# Patient Record
Sex: Female | Born: 1980 | Hispanic: No | Marital: Single | Smoking: Never smoker
Health system: Southern US, Community
[De-identification: ages and names within clinical notes are randomized; demographics above are authoritative.]

## PROBLEM LIST (undated history)

## (undated) DIAGNOSIS — F329 Major depressive disorder, single episode, unspecified: Secondary | ICD-10-CM

## (undated) DIAGNOSIS — F32A Depression, unspecified: Secondary | ICD-10-CM

## (undated) DIAGNOSIS — S62609A Fracture of unspecified phalanx of unspecified finger, initial encounter for closed fracture: Secondary | ICD-10-CM

## (undated) DIAGNOSIS — B009 Herpesviral infection, unspecified: Secondary | ICD-10-CM

## (undated) DIAGNOSIS — F419 Anxiety disorder, unspecified: Secondary | ICD-10-CM

## (undated) HISTORY — DX: Fracture of unspecified phalanx of unspecified finger, initial encounter for closed fracture: S62.609A

## (undated) HISTORY — DX: Depression, unspecified: F32.A

## (undated) HISTORY — DX: Major depressive disorder, single episode, unspecified: F32.9

## (undated) HISTORY — DX: Herpesviral infection, unspecified: B00.9

## (undated) HISTORY — PX: FACIAL FRACTURE SURGERY: SHX1570

## (undated) HISTORY — DX: Anxiety disorder, unspecified: F41.9

---

## 1998-08-05 ENCOUNTER — Other Ambulatory Visit: Admission: RE | Admit: 1998-08-05 | Discharge: 1998-08-05 | Payer: Self-pay | Admitting: Obstetrics and Gynecology

## 1999-09-21 ENCOUNTER — Other Ambulatory Visit: Admission: RE | Admit: 1999-09-21 | Discharge: 1999-09-21 | Payer: Self-pay | Admitting: Gynecology

## 2000-08-21 ENCOUNTER — Other Ambulatory Visit: Admission: RE | Admit: 2000-08-21 | Discharge: 2000-08-21 | Payer: Self-pay | Admitting: Gynecology

## 2001-04-03 ENCOUNTER — Encounter: Admission: RE | Admit: 2001-04-03 | Discharge: 2001-04-03 | Payer: Self-pay | Admitting: Gynecology

## 2001-04-30 ENCOUNTER — Encounter: Payer: Self-pay | Admitting: Emergency Medicine

## 2001-04-30 ENCOUNTER — Emergency Department (HOSPITAL_COMMUNITY): Admission: EM | Admit: 2001-04-30 | Discharge: 2001-04-30 | Payer: Self-pay

## 2001-05-27 ENCOUNTER — Inpatient Hospital Stay (HOSPITAL_COMMUNITY): Admission: AD | Admit: 2001-05-27 | Discharge: 2001-05-30 | Payer: Self-pay | Admitting: *Deleted

## 2001-07-16 ENCOUNTER — Other Ambulatory Visit: Admission: RE | Admit: 2001-07-16 | Discharge: 2001-07-16 | Payer: Self-pay | Admitting: Gynecology

## 2002-10-31 ENCOUNTER — Inpatient Hospital Stay (HOSPITAL_COMMUNITY): Admission: AD | Admit: 2002-10-31 | Discharge: 2002-10-31 | Payer: Self-pay | Admitting: Obstetrics and Gynecology

## 2002-10-31 ENCOUNTER — Encounter: Payer: Self-pay | Admitting: Obstetrics and Gynecology

## 2004-03-10 ENCOUNTER — Ambulatory Visit: Payer: Self-pay | Admitting: Family Medicine

## 2004-03-14 ENCOUNTER — Ambulatory Visit (HOSPITAL_COMMUNITY): Admission: RE | Admit: 2004-03-14 | Discharge: 2004-03-14 | Payer: Self-pay | Admitting: Family Medicine

## 2004-03-28 ENCOUNTER — Ambulatory Visit (HOSPITAL_COMMUNITY): Admission: RE | Admit: 2004-03-28 | Discharge: 2004-03-28 | Payer: Self-pay | Admitting: Family Medicine

## 2004-05-19 ENCOUNTER — Ambulatory Visit: Payer: Self-pay | Admitting: Sports Medicine

## 2004-05-27 ENCOUNTER — Ambulatory Visit: Payer: Self-pay | Admitting: Sports Medicine

## 2004-06-08 ENCOUNTER — Encounter: Admission: RE | Admit: 2004-06-08 | Discharge: 2004-06-08 | Payer: Self-pay | Admitting: Family Medicine

## 2004-06-10 ENCOUNTER — Ambulatory Visit: Payer: Self-pay | Admitting: Family Medicine

## 2004-06-29 ENCOUNTER — Ambulatory Visit: Payer: Self-pay | Admitting: Family Medicine

## 2004-08-04 ENCOUNTER — Ambulatory Visit: Payer: Self-pay | Admitting: Sports Medicine

## 2004-08-12 ENCOUNTER — Ambulatory Visit: Payer: Self-pay | Admitting: Family Medicine

## 2004-08-15 ENCOUNTER — Ambulatory Visit: Payer: Self-pay | Admitting: Obstetrics and Gynecology

## 2004-08-15 ENCOUNTER — Inpatient Hospital Stay (HOSPITAL_COMMUNITY): Admission: AD | Admit: 2004-08-15 | Discharge: 2004-08-15 | Payer: Self-pay | Admitting: *Deleted

## 2004-08-16 ENCOUNTER — Ambulatory Visit: Payer: Self-pay | Admitting: Family Medicine

## 2004-08-22 ENCOUNTER — Ambulatory Visit: Payer: Self-pay | Admitting: Sports Medicine

## 2004-08-25 ENCOUNTER — Ambulatory Visit: Payer: Self-pay | Admitting: Obstetrics & Gynecology

## 2004-08-25 ENCOUNTER — Inpatient Hospital Stay (HOSPITAL_COMMUNITY): Admission: AD | Admit: 2004-08-25 | Discharge: 2004-08-28 | Payer: Self-pay | Admitting: Obstetrics & Gynecology

## 2004-10-06 ENCOUNTER — Ambulatory Visit: Payer: Self-pay | Admitting: Family Medicine

## 2005-03-21 ENCOUNTER — Emergency Department (HOSPITAL_COMMUNITY): Admission: EM | Admit: 2005-03-21 | Discharge: 2005-03-21 | Payer: Self-pay | Admitting: Emergency Medicine

## 2005-07-28 ENCOUNTER — Ambulatory Visit: Payer: Self-pay | Admitting: Family Medicine

## 2005-07-31 ENCOUNTER — Ambulatory Visit: Payer: Self-pay | Admitting: Internal Medicine

## 2005-07-31 LAB — PULMONARY FUNCTION TEST

## 2005-08-17 ENCOUNTER — Ambulatory Visit: Payer: Self-pay | Admitting: Family Medicine

## 2005-09-05 ENCOUNTER — Ambulatory Visit: Payer: Self-pay | Admitting: Family Medicine

## 2005-09-28 ENCOUNTER — Ambulatory Visit: Payer: Self-pay | Admitting: Family Medicine

## 2005-12-03 ENCOUNTER — Encounter (INDEPENDENT_AMBULATORY_CARE_PROVIDER_SITE_OTHER): Payer: Self-pay | Admitting: *Deleted

## 2005-12-03 LAB — CONVERTED CEMR LAB

## 2005-12-15 ENCOUNTER — Ambulatory Visit: Payer: Self-pay | Admitting: Family Medicine

## 2006-04-27 ENCOUNTER — Encounter (INDEPENDENT_AMBULATORY_CARE_PROVIDER_SITE_OTHER): Payer: Self-pay | Admitting: *Deleted

## 2007-01-18 ENCOUNTER — Ambulatory Visit: Payer: Self-pay | Admitting: Family Medicine

## 2007-01-18 DIAGNOSIS — F419 Anxiety disorder, unspecified: Secondary | ICD-10-CM

## 2007-01-18 DIAGNOSIS — F329 Major depressive disorder, single episode, unspecified: Secondary | ICD-10-CM

## 2007-06-05 ENCOUNTER — Telehealth: Payer: Self-pay | Admitting: Family Medicine

## 2007-10-11 ENCOUNTER — Encounter: Payer: Self-pay | Admitting: Family Medicine

## 2007-12-31 ENCOUNTER — Telehealth: Payer: Self-pay | Admitting: Family Medicine

## 2008-01-13 ENCOUNTER — Encounter: Payer: Self-pay | Admitting: *Deleted

## 2008-01-13 ENCOUNTER — Ambulatory Visit: Payer: Self-pay | Admitting: Family Medicine

## 2008-01-15 ENCOUNTER — Ambulatory Visit: Payer: Self-pay | Admitting: Pulmonary Disease

## 2008-01-16 ENCOUNTER — Encounter: Payer: Self-pay | Admitting: Family Medicine

## 2009-12-10 ENCOUNTER — Emergency Department (HOSPITAL_COMMUNITY): Admission: EM | Admit: 2009-12-10 | Discharge: 2009-12-10 | Payer: Self-pay | Admitting: Emergency Medicine

## 2009-12-20 ENCOUNTER — Ambulatory Visit (HOSPITAL_BASED_OUTPATIENT_CLINIC_OR_DEPARTMENT_OTHER): Admission: RE | Admit: 2009-12-20 | Discharge: 2009-12-20 | Payer: Self-pay | Admitting: Otolaryngology

## 2010-07-15 NOTE — Discharge Summary (Signed)
[facility]  Patient:    Claudia Dominguez, Claudia Dominguez Visit Number: [id number] MRN: [id number]          Service Type: OBS Location: 910A 9108 01 Attending Physician:  Wetzel Bjornstad Dictated by:   Antony Contras, [facility] Admit Date:  05/27/2001 Discharge Date: 05/30/2001                             Discharge Summary  DISCHARGE DIAGNOSES:          1. Intrauterine pregnancy.                               2. Premature rupture of membranes.  PROCEDURE:                    Normal spontaneous vaginal delivery of viable infant over intact perineum with repair of first degree vaginal laceration.  HISTORY OF PRESENT ILLNESS:   The patient is a 30 year old primigravida with an LMP of August 23, 2000, [facility] May 30, 2001.  PRENATAL LABORATORY DATA:     Blood type B positive, antibody screen negative. RPR, HBSAG, and HIV nonreactive.  Rubella equivocal. GBS negative.  HOSPITAL COURSE:              The patient was admitted at [redacted] weeks gestation complaining of rupture of membranes 24 hours prior and was noted to have a positive fern and nitrazine.  She was admitted to labor and delivery for Pitocin augmentation and was also given Clindamycin secondary to penicillin allergy and being greater than 24 hours ruptured.  Cervix was 2 cm, 50%, and -2 station.  She did progress to complete dilatation and delivered an Apgars 9 and 64 female infant, weight 8 pounds 11 ounces over intact perineum with repair of first degree vaginal laceration.  Postpartum course, she remained afebrile, had no difficulty with voiding, and was able to be discharged in satisfactory condition on her second postpartum day.  CBC; hematocrit 24.5, hemoglobin 8.3, WBC 12.9, platelets 140.  DISPOSITION:                  Follow up in six weeks.  Continue with prenatal vitamins and iron.  Motrin for pain. Dictated by:   Antony Contras, [facility] Attending Physician:  Wetzel Bjornstad DD:  06/17/01 TD:   06/17/01 Job: [id number]

## 2010-07-15 NOTE — H&P (Signed)
[facility]  Patient:    Claudia Dominguez, Claudia Dominguez Visit Number: [id number] MRN: [id number]          Service Type: OBS Location: 910B 9157 01 Attending Physician:  Wetzel Bjornstad Dictated by:   Katy Fitch, M.D. Admit Date:  05/27/2001                           History and Physical  CHIEF COMPLAINT:              Rupture of membranes.  HISTORY OF PRESENT ILLNESS:   The patient is a 30 year old, G1, at 96 weeks, who presents to the office complaining of rupture of membranes 24 hours prior. The patient denies any fevers, nausea, vomiting, chills, vaginal bleeding, or decreased fetal movement.  The patient was seen in the office and noted to have positive fern and nitrazine and was admitted to labor and delivery for augmentation.  PAST MEDICAL HISTORY:         Negative.  PAST SURGICAL HISTORY:        Negative.  MEDICATIONS:                  Prenatal vitamins.  ALLERGIES:                    PENICILLIN.  SOCIAL HISTORY:               Denies any tobacco, alcohol, or drugs.  FAMILY HISTORY:               Without any mental retardation or epithelial cancers.  PHYSICAL EXAMINATION:  VITAL SIGNS:                  Blood pressure is 130/80.  HEENT:                        Clear.  LUNGS:                        Clear to auscultation bilaterally.  HEART:                        Regular rate and rhythm.  ABDOMEN:                      Gravid and nontender.  Estimated fetal weight 8 pounds 2 ounces.  CERVIX:                       2, 50, and -2.  Sterile speculum showed a positive Valsalva, nitrazine, and ferning.  ASSESSMENT AND PLAN:          38. A 30 year old G1 at 70 weeks with premature                                  rupture of membranes.                               2. Will admit to labor and delivery for                                  augmentation with Pitocin.  3. Will start clindamycin secondary to                         penicillin allergy and being greater than 18                                  hours ruptured. Dictated by:   Katy Fitch, M.D. Attending Physician:  Wetzel Bjornstad DD:  05/27/01 TD:  05/27/01 Job: [id number]

## 2010-12-28 ENCOUNTER — Emergency Department (INDEPENDENT_AMBULATORY_CARE_PROVIDER_SITE_OTHER): Payer: Self-pay

## 2010-12-28 ENCOUNTER — Emergency Department (HOSPITAL_BASED_OUTPATIENT_CLINIC_OR_DEPARTMENT_OTHER)
Admission: EM | Admit: 2010-12-28 | Discharge: 2010-12-29 | Disposition: A | Discharge status: Home / Self Care | Payer: Self-pay | Attending: Emergency Medicine | Admitting: Emergency Medicine

## 2010-12-28 DIAGNOSIS — M25579 Pain in unspecified ankle and joints of unspecified foot: Secondary | ICD-10-CM

## 2010-12-28 DIAGNOSIS — T148XXA Other injury of unspecified body region, initial encounter: Secondary | ICD-10-CM

## 2010-12-28 DIAGNOSIS — S93409A Sprain of unspecified ligament of unspecified ankle, initial encounter: Secondary | ICD-10-CM | POA: Insufficient documentation

## 2010-12-28 DIAGNOSIS — X500XXA Overexertion from strenuous movement or load, initial encounter: Secondary | ICD-10-CM

## 2010-12-28 DIAGNOSIS — M79609 Pain in unspecified limb: Secondary | ICD-10-CM

## 2010-12-28 DIAGNOSIS — S93402A Sprain of unspecified ligament of left ankle, initial encounter: Secondary | ICD-10-CM

## 2010-12-28 MED ORDER — IBUPROFEN 800 MG PO TABS: 800.0000 mg | ORAL_TABLET | Freq: Three times a day (TID) | ORAL | Status: AC

## 2010-12-28 MED ORDER — IBUPROFEN 800 MG PO TABS
800.0000 mg | ORAL_TABLET | Freq: Once | ORAL | Status: AC
  Administered 2010-12-28: 800 mg via ORAL
  Filled 2010-12-28: qty 1

## 2010-12-28 MED ORDER — OXYCODONE-ACETAMINOPHEN 5-325 MG PO TABS
1.0000 | ORAL_TABLET | Freq: Once | ORAL | Status: AC
  Administered 2010-12-28: 1 via ORAL
  Filled 2010-12-28: qty 1

## 2010-12-28 NOTE — ED Notes (Signed)
Fell down steps-pain to left foot and ankle

## 2010-12-29 NOTE — ED Provider Notes (Signed)
History     CSN: [id number] Arrival date & time: 12/28/2010  9:43 PM   First MD Initiated Contact with Patient 12/28/10 2150      Chief Complaint  Patient presents with  . Ankle Injury    (Consider location/radiation/quality/duration/timing/severity/associated sxs/prior treatment) Patient is a 30 y.o. female presenting with lower extremity injury. The history is provided by the patient.  Ankle Injury This is a new problem. The current episode started less than 1 hour ago. The problem occurs constantly. The problem has not changed since onset.Pertinent negatives include no chest pain, no abdominal pain and no shortness of breath. The symptoms are aggravated by standing and walking. The symptoms are relieved by nothing. She has tried nothing for the symptoms.   Walking down a step and inverted her left ankle with injury, pain and swelling. It hurts to bear weight. She did fall onto an outstretched left hand and sustained an abrasion but denies any significant injury. She denies any wrist elbow or shoulder pain he just denies any knee or hip injury. Severity is moderate. No radiation of ankle pain. Do not give medications prior to arrival. she is requesting Percocet History reviewed. No pertinent past medical history.  Past Surgical History  Procedure Date  . Facial fracture surgery     No family history on file.  History  Substance Use Topics  . Smoking status: Never Smoker   . Smokeless tobacco: Not on file  . Alcohol Use: Yes    OB History    Grav Para Term Preterm Abortions TAB SAB Ect Mult Living                  Review of Systems  Constitutional: Negative for fever and chills.  HENT: Negative for neck pain.   Eyes: Negative for pain.  Respiratory: Negative for shortness of breath.   Cardiovascular: Negative for chest pain.  Gastrointestinal: Negative for abdominal pain.  Genitourinary: Negative for dysuria.  Musculoskeletal: Negative for back pain.  Skin:  Negative for rash.  Neurological: Negative for weakness and numbness.  All other systems reviewed and are negative.    Allergies  Penicillins  Home Medications   Current Outpatient Rx  Name Route Sig Dispense Refill  . IBUPROFEN 800 MG PO TABS Oral Take 1 tablet (800 mg total) by mouth 3 (three) times daily. 21 tablet 0    BP 108/62  Pulse 64  Temp(Src) 97.9 F (36.6 C) (Oral)  Resp 16  Ht 5\' 7"  (1.702 m)  Wt 160 lb (72.576 kg)  BMI 25.06 kg/m2  SpO2 98%  LMP 11/28/2010  Physical Exam  Constitutional: She is oriented to person, place, and time. She appears well-developed and well-nourished.  HENT:  Head: Normocephalic and atraumatic.  Eyes: Conjunctivae and EOM are normal. Pupils are equal, round, and reactive to light.  Neck: Full passive range of motion without pain. Neck supple. No thyromegaly present.       No midline tenderness or deformity to cervical, thoracic and lumbar spine  Cardiovascular: Normal rate, regular rhythm, S1 normal, S2 normal and intact distal pulses.   Pulmonary/Chest: Effort normal and breath sounds normal.  Abdominal: Soft. Bowel sounds are normal. There is no tenderness. There is no CVA tenderness.  Musculoskeletal:       Left lower extremity: Tenderness and swelling over the area of the lateral malleolus. There is mild ecchymosis. Skin is intact throughout there is also mild tenderness over the dorsolateral aspect of the foot. Dorsalis pedis pulse is  equal and intact. Distal motor and sensorium intact. There is no tenderness over the proximal fibula, knee or the hip. Left hand: Mild superficial abrasion to the palm. No bony tenderness or swelling. No wrist, elbow or shoulder tenderness  Neurological: She is alert and oriented to person, place, and time. She has normal strength and normal reflexes. No cranial nerve deficit or sensory deficit. She displays a negative Romberg sign. GCS eye subscore is 4. GCS verbal subscore is 5. GCS motor subscore is  6.       No focal deficits  Skin: Skin is warm and dry. No rash noted. No cyanosis. Nails show no clubbing.  Psychiatric: She has a normal mood and affect. Her speech is normal and behavior is normal.    ED Course  Procedures (including critical care time)  Labs Reviewed - No data to display Dg Ankle Complete Left  12/28/2010  *RADIOLOGY REPORT*  Clinical Data: Left ankle pain after fall with twisting injury. Swelling.  LEFT ANKLE COMPLETE - 3+ VIEW  Comparison: None.  Findings: Lateral soft tissue swelling.  The left ankle appears intact.  Talar dome are intact. No evidence of acute fracture or subluxation.  No focal bone lesions.  Bone matrix and cortex appear intact.  No abnormal radiopaque densities in the soft tissues.  IMPRESSION: Lateral soft tissue swelling.  No acute is demonstrated.  Original Report Authenticated By: Marlon Pel, M.D.   Dg Foot Complete Left  12/28/2010  *RADIOLOGY REPORT*  Clinical Data: Left foot pain and swelling after trauma.  LEFT FOOT - COMPLETE 3+ VIEW  Comparison: None.  Findings: The left foot appears intact. No evidence of acute fracture or subluxation.  No focal bone lesions.  Bone matrix and cortex appear intact.  No abnormal radiopaque densities in the soft tissues.  IMPRESSION: No acute bony abnormalities demonstrated.  Original Report Authenticated By: Marlon Pel, M.D.     1. Sprain of left ankle   2. Abrasion       MDM   Left ankle injury after fall prior to arrival. Pain medications provided and x-rays obtained and reviewed as above. Splint placed and crutches provided. Questions answered. Ankle sprain precautions and instructions provided to patient states understanding. Stable for discharge home with plan to follow up as needed for any persistent or worsening symptoms.       Sunnie Nielsen, MD 12/29/10 [phone]

## 2011-06-27 ENCOUNTER — Other Ambulatory Visit: Payer: Self-pay | Admitting: Physician Assistant

## 2011-06-28 ENCOUNTER — Telehealth: Payer: Self-pay

## 2011-06-28 NOTE — Telephone Encounter (Signed)
We never received a request. Please pull paper chart to see what pill she is on.

## 2011-06-28 NOTE — Telephone Encounter (Signed)
Pt states she has an appointment on the 9th. Her birth control pills have been denied based on an office visit.  [facility]  Please call [phone]

## 2011-06-29 NOTE — Telephone Encounter (Signed)
Pt was upset when I returned her call.  Advised her that we were unable to fill meds. She states that she was bleeding and has been for a while and that she has been waiting 3 days on her to give her an answer.  She used cursed words and got really upset.  Advised her that per Marylene Land it is against the medical board to refill med without being seen in a year.  Advised pt that she is welcome to come in today before her appt and she stated that she could not come in.  She stated that she did have a Pap with the [facility]..  I was speaking with Marylene Land in regards to her maybe calling the Health Dept to get a refill since she went there when she did not have insurance and pt hung up on me.

## 2011-06-29 NOTE — Telephone Encounter (Signed)
Unable to prescribe the medication because even though she has an appointment, it's been 1 1/2 yrs since she has been here

## 2011-07-06 ENCOUNTER — Ambulatory Visit (INDEPENDENT_AMBULATORY_CARE_PROVIDER_SITE_OTHER): Payer: No Typology Code available for payment source | Admitting: Physician Assistant

## 2011-07-06 ENCOUNTER — Encounter: Payer: Self-pay | Admitting: Physician Assistant

## 2011-07-06 VITALS — BP 112/75 | HR 75 | Temp 97.1°F | Resp 18 | Ht 68.0 in | Wt 160.0 lb

## 2011-07-06 DIAGNOSIS — F411 Generalized anxiety disorder: Secondary | ICD-10-CM

## 2011-07-06 DIAGNOSIS — M25559 Pain in unspecified hip: Secondary | ICD-10-CM

## 2011-07-06 DIAGNOSIS — Z309 Encounter for contraceptive management, unspecified: Secondary | ICD-10-CM

## 2011-07-06 DIAGNOSIS — F419 Anxiety disorder, unspecified: Secondary | ICD-10-CM

## 2011-07-06 MED ORDER — MELOXICAM 15 MG PO TABS: 15.0000 mg | ORAL_TABLET | Freq: Every day | ORAL | Status: AC

## 2011-07-06 MED ORDER — ALPRAZOLAM 0.25 MG PO TABS: 0.2500 mg | ORAL_TABLET | Freq: Three times a day (TID) | ORAL | Status: AC | PRN

## 2011-07-06 MED ORDER — NORGESTIMATE-ETH ESTRADIOL 0.25-35 MG-MCG PO TABS: 1.0000 | ORAL_TABLET | Freq: Every day | ORAL | Status: DC

## 2011-07-06 NOTE — Progress Notes (Signed)
  Subjective:    Patient ID: Claudia Dominguez, female    DOB: 04-06-1980, 31 y.o.   MRN: [id number]  HPI  Anxiety causing SOB.  At work-likes being busy.  Social situations. Daily for the last few months. No URI-type or allergy-type symptoms.  Both hips hurting.  Worse after, not during, activity.  Running, extended walking, sex (especially if she's on top, or legs are up).  No improvement with NSAIDS.  Some better with sitting, lying down, but not complete resolution.  She needs a refill of her COC.  Review of Systems As above.    Objective:   Physical Exam Vital signs noted. Well-developed, well nourished WF who is awake, alert and oriented, in NAD. HEENT: [place]/AT, sclera and conjunctiva are clear.   Neck: supple, non-tender, no lymphadenopathy, thyromegaly. Heart: RRR, no murmur Lungs: CTA Abdomen: normo-active bowel sounds, supple, non-tender, no mass or organomegaly. Extremities: no cyanosis, clubbing or edema.  FROM of the hips and knees.  Pain anterior hips with full flexion and with aDduction.  No tenderness on palpation. Skin: warm and dry without rash.      Assessment & Plan:   1. Contraception management  norgestimate-ethinyl estradiol (ORTHO-CYCLEN,SPRINTEC,PREVIFEM) 0.25-35 MG-MCG tablet  2. Hip pain  Trial of daily Meloxicam.  If symptoms persist, will refer to ortho and/or PT.  3. Anxiety  Prn Alprazolam.  Schedule CPE in 3-4 weeks.  Reassess then.

## 2011-07-06 NOTE — Patient Instructions (Signed)
Driven to Distraction

## 2011-09-14 ENCOUNTER — Ambulatory Visit: Payer: No Typology Code available for payment source | Admitting: Physician Assistant

## 2011-11-28 ENCOUNTER — Telehealth: Payer: Self-pay

## 2011-11-28 NOTE — Telephone Encounter (Signed)
I think patient needs to come in for this issue. She is asking for a call back. I am happy to call her back, she lacks insurance but if her heart beat is rapid she needs to be seen, especially if her meds are not helping.

## 2011-11-28 NOTE — Telephone Encounter (Signed)
Pulled chart and there is no record of this, she needs evaluation for this. Patient has been taking Xanax and now states she feels her heart racing. She is upset at need for office visit and has disconnected phone call. She is very stressed about lack of insurance and need for office visit. I think it is very important she come in for office visit to evaluate this. She states Xanax does not help at all. I am going to call her back tomorrow to speak to her again. She seems very upset and stressed, I need to speak to her again to advise of need for evaluation of this, even if it is not here. I will call her back tomorrow.

## 2011-11-28 NOTE — Telephone Encounter (Signed)
It does not seem like she was here for a rapid heartbeat. Is this a new issue? If so she will likely need to be re-evaluated. Which medication is not helping? She was placed on several at last OV.

## 2011-11-28 NOTE — Telephone Encounter (Signed)
Thanks, I called patient back about this

## 2011-11-28 NOTE — Telephone Encounter (Signed)
Pt states heart beat is too rapid,meds not helping,requesting call back from chelle in this regard,cannot come back in due to lack of ins.    Best phone [phone]

## 2011-11-28 NOTE — Telephone Encounter (Signed)
Patient indicates she was here in August for this issue, there is nothing in computer, I will pull her paper chart, and see what is in there.

## 2011-11-30 NOTE — Telephone Encounter (Signed)
Left message for her to reiterate the importance of office visit or ER visit for eval of shortness of breath and heart racing.

## 2012-07-05 ENCOUNTER — Encounter: Payer: Self-pay | Admitting: Internal Medicine

## 2012-08-21 ENCOUNTER — Other Ambulatory Visit: Payer: Self-pay | Admitting: Physician Assistant

## 2012-09-26 ENCOUNTER — Ambulatory Visit: Payer: No Typology Code available for payment source | Admitting: Physician Assistant

## 2012-10-10 ENCOUNTER — Ambulatory Visit: Payer: No Typology Code available for payment source | Admitting: Physician Assistant

## 2012-10-17 ENCOUNTER — Other Ambulatory Visit: Payer: Self-pay | Admitting: Physician Assistant

## 2012-10-18 ENCOUNTER — Other Ambulatory Visit: Payer: Self-pay | Admitting: Physician Assistant

## 2013-01-16 ENCOUNTER — Encounter: Payer: No Typology Code available for payment source | Admitting: Physician Assistant

## 2013-04-24 ENCOUNTER — Ambulatory Visit: Payer: No Typology Code available for payment source | Admitting: Physician Assistant

## 2013-05-01 ENCOUNTER — Ambulatory Visit: Payer: No Typology Code available for payment source | Admitting: Physician Assistant

## 2013-05-01 ENCOUNTER — Telehealth: Payer: Self-pay | Admitting: General Practice

## 2013-05-01 NOTE — Telephone Encounter (Signed)
Patient called wanting to know if she can get a refill on ALPRAZolam (XANAX) 0.25 MG tablet  Please advise

## 2013-05-01 NOTE — Telephone Encounter (Signed)
Left detailed message on machine, also expressed to call back with any questions.

## 2013-05-01 NOTE — Telephone Encounter (Signed)
Has not been seen here in nearly 2 yrs.  Needs OV to establish with a PCP

## 2013-05-02 NOTE — Telephone Encounter (Signed)
Based on review of her chart, she was last seen here 07/06/2011.  I am not able to refill any medications for her based on that information.  Has she been seen here under another name?  Please verify name, dob, etc and double check that we are evaluating the correct patient/chart.  If we are, then encourage her to come in for evaluation.

## 2013-05-02 NOTE — Telephone Encounter (Signed)
Spoke to patient, she stated she is "in here all the time" with her family as well. Last OV i see is 07/06/11. She stated this did not seem correct. Would like a refill for xanax. Please advise.

## 2013-05-02 NOTE — Telephone Encounter (Signed)
LM for rtn call. 

## 2013-05-05 NOTE — Telephone Encounter (Signed)
Pt.notified

## 2013-05-29 ENCOUNTER — Ambulatory Visit: Payer: No Typology Code available for payment source | Admitting: Physician Assistant

## 2014-01-09 ENCOUNTER — Ambulatory Visit (INDEPENDENT_AMBULATORY_CARE_PROVIDER_SITE_OTHER): Payer: BC Managed Care – PPO | Admitting: Family Medicine

## 2014-01-09 ENCOUNTER — Encounter: Payer: Self-pay | Admitting: Family Medicine

## 2014-01-09 ENCOUNTER — Ambulatory Visit: Payer: Self-pay | Admitting: Family Medicine

## 2014-01-09 ENCOUNTER — Ambulatory Visit (INDEPENDENT_AMBULATORY_CARE_PROVIDER_SITE_OTHER): Payer: BC Managed Care – PPO

## 2014-01-09 VITALS — BP 112/72 | HR 63 | Temp 97.7°F | Ht 67.0 in | Wt 177.0 lb

## 2014-01-09 DIAGNOSIS — S62609A Fracture of unspecified phalanx of unspecified finger, initial encounter for closed fracture: Secondary | ICD-10-CM | POA: Diagnosis not present

## 2014-01-09 DIAGNOSIS — R635 Abnormal weight gain: Secondary | ICD-10-CM | POA: Diagnosis not present

## 2014-01-09 DIAGNOSIS — M25742 Osteophyte, left hand: Secondary | ICD-10-CM

## 2014-01-09 DIAGNOSIS — H6983 Other specified disorders of Eustachian tube, bilateral: Secondary | ICD-10-CM

## 2014-01-09 DIAGNOSIS — S62657S Nondisplaced fracture of medial phalanx of left little finger, sequela: Secondary | ICD-10-CM

## 2014-01-09 DIAGNOSIS — M79645 Pain in left finger(s): Secondary | ICD-10-CM

## 2014-01-09 DIAGNOSIS — Y93K1 Activity, walking an animal: Secondary | ICD-10-CM

## 2014-01-09 DIAGNOSIS — H6990 Unspecified Eustachian tube disorder, unspecified ear: Secondary | ICD-10-CM | POA: Insufficient documentation

## 2014-01-09 DIAGNOSIS — H6980 Other specified disorders of Eustachian tube, unspecified ear: Secondary | ICD-10-CM | POA: Insufficient documentation

## 2014-01-09 DIAGNOSIS — X58XXXS Exposure to other specified factors, sequela: Secondary | ICD-10-CM

## 2014-01-09 HISTORY — DX: Fracture of unspecified phalanx of unspecified finger, initial encounter for closed fracture: S62.609A

## 2014-01-09 MED ORDER — TOPIRAMATE 50 MG PO TABS: 50.0000 mg | ORAL_TABLET | Freq: Two times a day (BID) | ORAL | Status: DC

## 2014-01-09 NOTE — Progress Notes (Signed)
CC: Claudia Dominguez is a 33 y.o. female is here for Establish Care; Finger Injury; and Ear Pain   Subjective: HPI:  Pleasant 33 year old here to establish care   patient planes of difficulty with weight gain over the past 4 months.She tells me she watches what she eats, she exercises regularly and despite this has gained adipose over the last 4 months. She's never had this difficulty in the past. She has no family history of thyroid disease. She has not considered any medication to help with this as of yet.  She complains of left finger pain and swelling that has been present since September, early September. It happened within minutes after she was walking her dog holding the leash with only her left small finger and with a sudden jerk "dislocated"her finger in her own words. She tells me that the end of the finger was bent at a 90 angle and she straightened it itherself within seconds after the injury. Since then she's had mild pain and moderate swelling at the DIP with lateral deviation of the distal phalanges. Interventions have included buddy taping. Doesn't particularly makes this pain or swelling better or worse.  episodic bilateral ear pain that has been present for matter of years that occurs a few times a week. Nothing particularly makes it better or worse other than waiting for 30 seconds for it to go away on its own. Is described as sharp, deep within the ear and nonradiating. Interventions have included Mucinex with no help.  Review of Systems - General ROS: negative for - chills, fever, night sweats, weight gain or weight loss Ophthalmic ROS: negative for - decreased vision Psychological ROS: negative for - anxiety or depression ENT ROS: negative for - hearing change, nasal congestion, tinnitus or allergies Hematological and Lymphatic ROS: negative for - bleeding problems, bruising or swollen lymph nodes Breast ROS: negative Respiratory ROS: no cough, shortness of breath, or  wheezing Cardiovascular ROS: no chest pain or dyspnea on exertion Gastrointestinal ROS: no abdominal pain, change in bowel habits, or black or bloody stools Genito-Urinary ROS: negative for - genital discharge, genital ulcers, incontinence or abnormal bleeding from genitals Musculoskeletal ROS: negative for - joint pain or muscle painother than that described above Neurological ROS: negative for - headaches or memory loss Dermatological ROS: negative for lumps, mole changes, rash and skin lesion changes  History reviewed. No pertinent past medical history.  Past Surgical History  Procedure Laterality Date  . Facial fracture surgery     Family History  Problem Relation Age of Onset  . Alcoholism      parent  . Leukemia      grandparent  . Heart attack      grandmother  . Diabetes      grandmother  . Hyperlipidemia Father   . Stroke      grandmother     History   Social History  . Marital Status: Single    Spouse Name: N/A    Number of Children: N/A  . Years of Education: N/A   Occupational History  . Not on file.   Social History Main Topics  . Smoking status: Never Smoker   . Smokeless tobacco: Not on file  . Alcohol Use: 0.0 oz/week    0 Not specified per week     Comment: occasional  . Drug Use: No  . Sexual Activity: Not Currently    Birth Control/ Protection: None   Other Topics Concern  . Not on file   Social  History Narrative     Objective: BP 112/72 mmHg  Pulse 63  Temp(Src) 97.7 F (36.5 C) (Oral)  Ht 5\' 7"  (1.702 m)  Wt 177 lb (80.287 kg)  BMI 27.72 kg/m2  General: Alert and Oriented, No Acute Distress HEENT: Pupils equal, round, reactive to light. Conjunctivae clear.  External ears unremarkable, canals clear with intact TMs with appropriate landmarks.  Middle ear appears open without effusion. Pink inferior turbinates.  Moist mucous membranes, pharynx without inflammation nor lesions.  Neck supple without palpable lymphadenopathy nor abnormal  masses. Lungs: clear comfortable work of breathing Extremities: No peripheral edema.  Strong peripheral pulses. She has good range of motion at the MCP, DIP, PIP of the left fifth digit when joints are isolated. There is moderate swelling and redness from the  PIP to just past the DIP, mild lateral deviation of the distal phalanges. Mental Status: No depression, anxiety, nor agitation. Skin: Warm and dry.  Assessment & Plan: Emi HolesCharlee was seen today for establish care, finger injury and ear pain.  Diagnoses and associated orders for this visit:  Abnormal weight gain - topiramate (TOPAMAX) 50 MG tablet; Take 1 tablet (50 mg total) by mouth 2 (two) times daily.  Finger pain, left - DG Finger Little Left; Future  Finger fracture, left, closed, initial encounter  Eustachian tube dysfunction, bilateral    Abnormal weight gain: Start Topamax, we will check TSH with upcoming physical. Joint decision to not start phentermine due to her history of anxiety and palpitations Left finger pain: X-ray was obtained showing a nondisplaced healing phalangeal shaft fracture. I'm not sure there is much to be done for this but I will discuss this with Dr. Karie Schwalbe in sports medicine before providing advice to the patient. Eustachian tube dysfunction: Encouraged her to try over-the-counter Flonase or Nasacort.  Return for As needed for annual physical..

## 2014-01-12 ENCOUNTER — Ambulatory Visit: Payer: Self-pay | Admitting: Family Medicine

## 2014-01-19 ENCOUNTER — Encounter: Payer: BC Managed Care – PPO | Admitting: Family Medicine

## 2014-01-19 ENCOUNTER — Telehealth: Payer: Self-pay | Admitting: *Deleted

## 2014-01-19 NOTE — Telephone Encounter (Signed)
Pt called and left a message on vm that she was scheduled at 830 for an appt today and arrived at 844 and was turned away. I called her back and left a message on her vm that our office policy is that if you are 10 min late for your appointment then you may be asked to reschedule.

## 2014-01-30 ENCOUNTER — Encounter: Payer: BC Managed Care – PPO | Admitting: Family Medicine

## 2014-02-02 ENCOUNTER — Encounter: Payer: Self-pay | Admitting: Family Medicine

## 2014-02-02 ENCOUNTER — Ambulatory Visit (INDEPENDENT_AMBULATORY_CARE_PROVIDER_SITE_OTHER): Payer: BC Managed Care – PPO | Admitting: Family Medicine

## 2014-02-02 VITALS — BP 110/69 | HR 69 | Ht 67.0 in | Wt 176.0 lb

## 2014-02-02 DIAGNOSIS — F418 Other specified anxiety disorders: Secondary | ICD-10-CM

## 2014-02-02 DIAGNOSIS — T7421XA Adult sexual abuse, confirmed, initial encounter: Secondary | ICD-10-CM | POA: Diagnosis not present

## 2014-02-02 DIAGNOSIS — F419 Anxiety disorder, unspecified: Secondary | ICD-10-CM

## 2014-02-02 DIAGNOSIS — Z0000 Encounter for general adult medical examination without abnormal findings: Secondary | ICD-10-CM | POA: Diagnosis not present

## 2014-02-02 DIAGNOSIS — F329 Major depressive disorder, single episode, unspecified: Secondary | ICD-10-CM

## 2014-02-02 MED ORDER — ESCITALOPRAM OXALATE 20 MG PO TABS: 20.0000 mg | ORAL_TABLET | Freq: Every day | ORAL | Status: DC
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
## 2014-02-03 LAB — LIPID PANEL
Cholesterol: 149 mg/dL (ref 0–200)
HDL: 42 mg/dL (ref >39)
LDL CALC: 89 mg/dL (ref 0–99)
Total CHOL/HDL Ratio: 3.5 Ratio
Triglycerides: 88 mg/dL (ref <150)
VLDL: 18 mg/dL (ref 0–40)

## 2014-02-03 LAB — COMPLETE METABOLIC PANEL WITH GFR
ALBUMIN: 4 g/dL (ref 3.5–5.2)
ALT: 10 U/L (ref 0–35)
AST: 14 U/L (ref 0–37)
Alkaline Phosphatase: 59 U/L (ref 39–117)
BILIRUBIN TOTAL: 0.5 mg/dL (ref 0.2–1.2)
BUN: 15 mg/dL (ref 6–23)
CHLORIDE: 110 meq/L (ref 96–112)
CO2: 24 meq/L (ref 19–32)
Calcium: 9.1 mg/dL (ref 8.4–10.5)
Creat: 0.84 mg/dL (ref 0.50–1.10)
GFR, Est Non African American: >89 mL/min
Glucose, Bld: 87 mg/dL (ref 70–99)
POTASSIUM: 4.3 meq/L (ref 3.5–5.3)
SODIUM: 147 meq/L — AB (ref 135–145)
TOTAL PROTEIN: 6.4 g/dL (ref 6.0–8.3)

## 2014-02-03 LAB — TSH: TSH: 1.844 u[IU]/mL (ref 0.350–4.500)

## 2014-02-03 LAB — RPR

## 2014-02-03 LAB — HIV ANTIBODY (ROUTINE TESTING W REFLEX): HIV: NONREACTIVE

## 2014-02-03 LAB — GC/CHLAMYDIA PROBE AMP
CT PROBE, AMP APTIMA: NEGATIVE
GC Probe RNA: NEGATIVE

## 2014-02-03 LAB — HEPATITIS C ANTIBODY: HCV Ab: NEGATIVE

## 2014-07-07 ENCOUNTER — Telehealth: Payer: Self-pay | Admitting: *Deleted

## 2014-07-07 MED ORDER — NORGESTIMATE-ETH ESTRADIOL 0.25-35 MG-MCG PO TABS: 1.0000 | ORAL_TABLET | Freq: Every day | ORAL | Status: DC

## 2014-07-07 NOTE — Telephone Encounter (Signed)
I'm ok with refilling the ortho - cyclen that's in her historical med list, Rx sent to [facility] on [address].

## 2014-07-07 NOTE — Telephone Encounter (Signed)
Pt.notified

## 2014-07-07 NOTE — Telephone Encounter (Signed)
Pt is requesting a rx for birth control. She was here in December for a CPE and she is current on her pap. Since this will be a new start birth control and has never been rx'd birth control from our office nor was this discussed at the last visit she needs an appt to discuss options,risks and benefits.Called and advised pt of this. Pt states Dr. Ivan AnchorsHommel said that she could call back and would write a rx for birth control

## 2014-07-07 NOTE — Telephone Encounter (Signed)
Message left on vm 

## 2014-09-15 ENCOUNTER — Ambulatory Visit (INDEPENDENT_AMBULATORY_CARE_PROVIDER_SITE_OTHER): Payer: Self-pay | Admitting: Family Medicine

## 2014-09-15 ENCOUNTER — Encounter: Payer: Self-pay | Admitting: Family Medicine

## 2014-09-15 VITALS — BP 114/70 | HR 62 | Wt 184.0 lb

## 2014-09-15 DIAGNOSIS — Z30014 Encounter for initial prescription of intrauterine contraceptive device: Secondary | ICD-10-CM

## 2014-09-15 DIAGNOSIS — Z3009 Encounter for other general counseling and advice on contraception: Secondary | ICD-10-CM | POA: Insufficient documentation

## 2014-09-15 DIAGNOSIS — S338XXA Sprain of other parts of lumbar spine and pelvis, initial encounter: Secondary | ICD-10-CM

## 2014-09-15 DIAGNOSIS — R635 Abnormal weight gain: Secondary | ICD-10-CM

## 2014-09-15 DIAGNOSIS — F1210 Cannabis abuse, uncomplicated: Secondary | ICD-10-CM

## 2014-09-15 DIAGNOSIS — S39012A Strain of muscle, fascia and tendon of lower back, initial encounter: Secondary | ICD-10-CM | POA: Insufficient documentation

## 2014-09-15 MED ORDER — CYCLOBENZAPRINE HCL 5 MG PO TABS: 5.0000 mg | ORAL_TABLET | Freq: Every evening | ORAL | Status: DC | PRN

## 2014-09-15 MED ORDER — NAPROXEN 500 MG PO TABS: 500.0000 mg | ORAL_TABLET | Freq: Two times a day (BID) | ORAL | Status: DC

## 2014-09-15 MED ORDER — TRAMADOL HCL 50 MG PO TABS: 50.0000 mg | ORAL_TABLET | Freq: Three times a day (TID) | ORAL | Status: DC | PRN

## 2014-09-15 NOTE — Assessment & Plan Note (Signed)
Lumbosacral strain. Trial of Flexeril naproxen and tramadol. Watchful waiting return as needed. Home exercise program as well as referral to physical therapy.

## 2014-09-15 NOTE — Assessment & Plan Note (Signed)
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 She agrees. Work on quitting. Discussed with counselor.
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
## 2014-09-15 NOTE — Assessment & Plan Note (Signed)
Patient seems to be a good candidate for Mirena. Refer to OB/GYN

## 2014-09-15 NOTE — Assessment & Plan Note (Signed)
No other medications to try at this time that are not incredibly expensive. Work on quitting marijuana.

## 2014-09-15 NOTE — Patient Instructions (Addendum)
.  Thank you for coming in today. Take naproxen twice daily and use Flexeril and tramadol sparingly for severe pain. Attend physical therapy Talk to your counsel about quitting marijuana OB/GYN should call you to discuss Mirena  Come back or go to the emergency room if you notice new weakness new numbness problems walking or bowel or bladder problems.  Lumbosacral Strain Lumbosacral strain is a strain of any of the parts that make up your lumbosacral vertebrae. Your lumbosacral vertebrae are the bones that make up the lower third of your backbone. Your lumbosacral vertebrae are held together by muscles and tough, fibrous tissue (ligaments).  CAUSES  A sudden blow to your back can cause lumbosacral strain. Also, anything that causes an excessive stretch of the muscles in the low back can cause this strain. This is typically seen when people exert themselves strenuously, fall, lift heavy objects, bend, or crouch repeatedly. RISK FACTORS  Physically demanding work.  Participation in pushing or pulling sports or sports that require a sudden twist of the back (tennis, golf, baseball).  Weight lifting.  Excessive lower back curvature.  Forward-tilted pelvis.  Weak back or abdominal muscles or both.  Tight hamstrings. SIGNS AND SYMPTOMS  Lumbosacral strain may cause pain in the area of your injury or pain that moves (radiates) down your leg.  DIAGNOSIS Your health care provider can often diagnose lumbosacral strain through a physical exam. In some cases, you may need tests such as X-ray exams.  TREATMENT  Treatment for your lower back injury depends on many factors that your clinician will have to evaluate. However, most treatment will include the use of anti-inflammatory medicines. HOME CARE INSTRUCTIONS   Avoid hard physical activities (tennis, racquetball, waterskiing) if you are not in proper physical condition for it. This may aggravate or create problems.  If you have a back  problem, avoid sports requiring sudden body movements. Swimming and walking are generally safer activities.  Maintain good posture.  Maintain a healthy weight.  For acute conditions, you may put ice on the injured area.  Put ice in a plastic bag.  Place a towel between your skin and the bag.  Leave the ice on for 20 minutes, 2-3 times a day.  When the low back starts healing, stretching and strengthening exercises may be recommended. SEEK MEDICAL CARE IF:  Your back pain is getting worse.  You experience severe back pain not relieved with medicines. SEEK IMMEDIATE MEDICAL CARE IF:   You have numbness, tingling, weakness, or problems with the use of your arms or legs.  There is a change in bowel or bladder control.  You have increasing pain in any area of the body, including your belly (abdomen).  You notice shortness of breath, dizziness, or feel faint.  You feel sick to your stomach (nauseous), are throwing up (vomiting), or become sweaty.  You notice discoloration of your toes or legs, or your feet get very cold. MAKE SURE YOU:   Understand these instructions.  Will watch your condition.  Will get help right away if you are not doing well or get worse. Document Released: 11/23/2004 Document Revised: 02/18/2013 Document Reviewed: 10/02/2012 [facility] Patient Information 2015 [employer] [place]. This information is not intended to replace advice given to you by your health care provider. Make sure you discuss any questions you have with your health care provider.

## 2014-09-18 ENCOUNTER — Telehealth: Payer: Self-pay

## 2014-09-18 NOTE — Telephone Encounter (Signed)
Called and left detailed VM with pt informing her that Dr. Denyse Amass thinks that contrave would be a good med for weigh loss for her. Requested a call back to schedule an appointment to discuss this further.

## 2014-10-06 ENCOUNTER — Telehealth: Payer: Self-pay

## 2014-10-06 NOTE — Telephone Encounter (Signed)
Spoke with patient, will call when ready to make appt.

## 2014-11-16 ENCOUNTER — Other Ambulatory Visit: Payer: Self-pay | Admitting: *Deleted

## 2014-11-16 DIAGNOSIS — F329 Major depressive disorder, single episode, unspecified: Secondary | ICD-10-CM

## 2014-11-16 DIAGNOSIS — F32A Depression, unspecified: Secondary | ICD-10-CM

## 2014-11-16 DIAGNOSIS — F419 Anxiety disorder, unspecified: Principal | ICD-10-CM

## 2014-11-16 MED ORDER — ESCITALOPRAM OXALATE 20 MG PO TABS: 20.0000 mg | ORAL_TABLET | Freq: Every day | ORAL | Status: DC

## 2015-01-06 ENCOUNTER — Other Ambulatory Visit: Payer: Self-pay

## 2015-01-06 DIAGNOSIS — F419 Anxiety disorder, unspecified: Principal | ICD-10-CM

## 2015-01-06 DIAGNOSIS — F329 Major depressive disorder, single episode, unspecified: Secondary | ICD-10-CM

## 2015-01-06 MED ORDER — ESCITALOPRAM OXALATE 20 MG PO TABS: 20.0000 mg | ORAL_TABLET | Freq: Every day | ORAL | Status: DC

## 2015-01-06 NOTE — Telephone Encounter (Signed)
15 day supply provided, follow up needed for any future refills.  If she's unable to afford an appointment with our office there is a free clinic in [place] that I used to work at that might be a good alternative until she gets insurance, they can be contacted at [phone]. [facility]

## 2015-01-06 NOTE — Telephone Encounter (Signed)
Pt is due for a follow up visit.  Pt was contacted to inform her that she is due for a f/u visit.  She expressed that she currently has no health insurance and asks that you just refill this medication without an visit. You have not seen this pt since last Dec.

## 2015-01-06 NOTE — Telephone Encounter (Signed)
Pt notified.  Pt wasn't not happy with the decision and was transferred to [place].

## 2015-03-10 ENCOUNTER — Encounter: Payer: Self-pay | Admitting: Family Medicine

## 2015-03-10 ENCOUNTER — Ambulatory Visit (INDEPENDENT_AMBULATORY_CARE_PROVIDER_SITE_OTHER): Payer: Self-pay | Admitting: Family Medicine

## 2015-03-10 DIAGNOSIS — Z0289 Encounter for other administrative examinations: Secondary | ICD-10-CM

## 2015-03-10 NOTE — Progress Notes (Signed)
No show 03/10/2015

## 2015-09-26 IMAGING — CR DG FINGER LITTLE 2+V*L*
1 series · 1 of 1 positions shown · non-contrast
Comparison: None.

CLINICAL DATA: Pinky caught in dog leash 6 weeks ago with obvious
deformity.

EXAM:
LEFT LITTLE FINGER 2+V

[view not recorded]
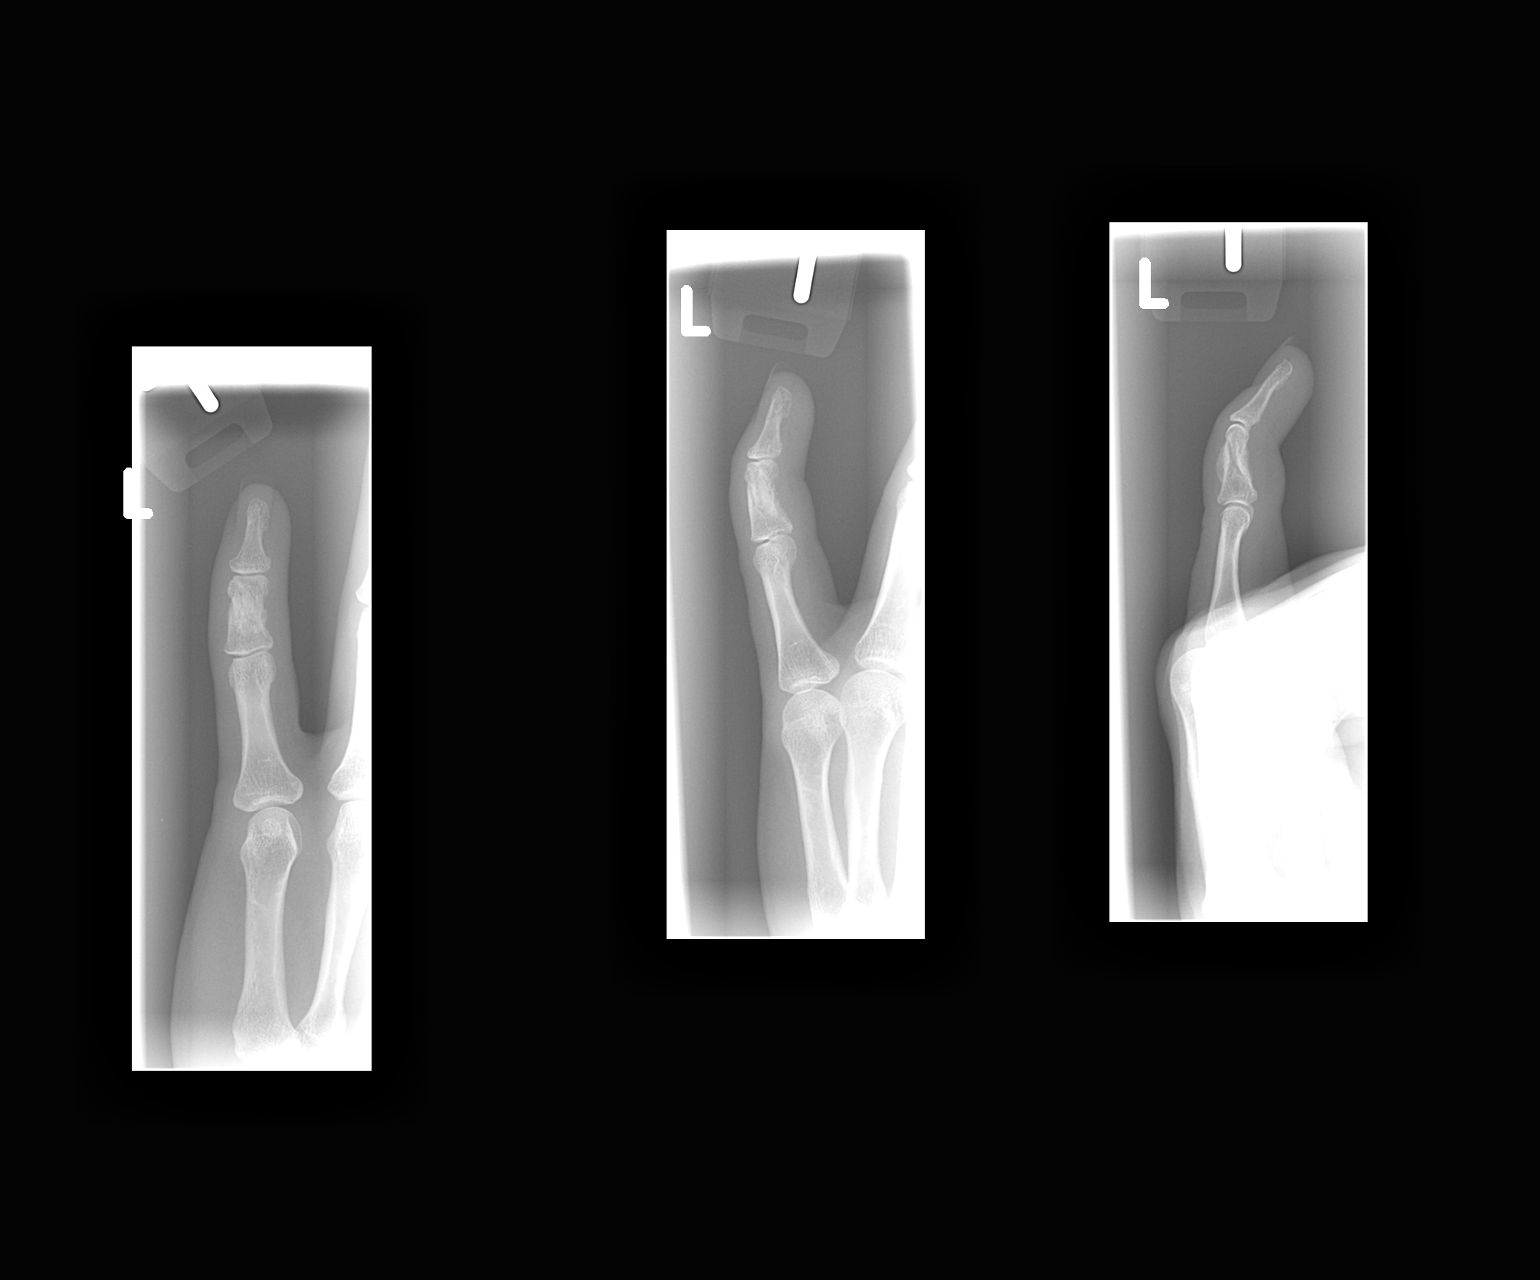

[1 of 1 positions shown; findings below may reference images not displayed]

FINDINGS: There is callus formation about a nondisplaced though slightly
comminuted fracture involving the mid aspect of the middle phalanx
of the fifth digit. No definite intra-articular extension. Joint
spaces are preserved. No erosions. Expected adjacent soft tissue
swelling. No radiopaque foreign body.
IMPRESSION: Callus formation about a nondisplaced though slightly comminuted
fracture involving the mid aspect of the middle phalanx of the fifth
digit.

## 2015-10-18 ENCOUNTER — Ambulatory Visit: Payer: Self-pay | Admitting: Family Medicine

## 2015-11-03 ENCOUNTER — Ambulatory Visit (INDEPENDENT_AMBULATORY_CARE_PROVIDER_SITE_OTHER): Payer: Self-pay | Admitting: Physician Assistant

## 2015-11-03 ENCOUNTER — Encounter: Payer: Self-pay | Admitting: Physician Assistant

## 2015-11-03 VITALS — BP 114/78 | HR 68 | Temp 98.0°F | Resp 17 | Ht 67.0 in | Wt 174.0 lb

## 2015-11-03 DIAGNOSIS — Z3009 Encounter for other general counseling and advice on contraception: Secondary | ICD-10-CM

## 2015-11-03 DIAGNOSIS — F32A Depression, unspecified: Secondary | ICD-10-CM

## 2015-11-03 DIAGNOSIS — F329 Major depressive disorder, single episode, unspecified: Secondary | ICD-10-CM

## 2015-11-03 DIAGNOSIS — F419 Anxiety disorder, unspecified: Principal | ICD-10-CM

## 2015-11-03 DIAGNOSIS — B009 Herpesviral infection, unspecified: Secondary | ICD-10-CM | POA: Insufficient documentation

## 2015-11-03 DIAGNOSIS — F418 Other specified anxiety disorders: Secondary | ICD-10-CM

## 2015-11-03 MED ORDER — NORGESTIMATE-ETH ESTRADIOL 0.25-35 MG-MCG PO TABS: 1.0000 | ORAL_TABLET | Freq: Every day | ORAL | 4 refills | Status: DC

## 2015-11-03 MED ORDER — ESCITALOPRAM OXALATE 20 MG PO TABS: 20.0000 mg | ORAL_TABLET | Freq: Every day | ORAL | 3 refills | Status: DC

## 2015-11-03 MED ORDER — VALACYCLOVIR HCL 1 G PO TABS
500.0000 mg | ORAL_TABLET | Freq: Every day | ORAL | 3 refills | Status: DC
Start: 2015-11-03 — End: 2017-07-19

## 2015-11-03 NOTE — Progress Notes (Signed)
Patient ID: Claudia Dominguez, female    DOB: 1980/11/07, 35 y.o.   MRN: [id number]  PCP: Laren Boom, DO  Subjective:   Chief Complaint  Patient presents with  . Medication Refill    lexapro. PHQ9 score 16  . Other    pt requesting birth control    HPI Presents for evaluation of depression/anxiety and needing prescription refills. She does not currently have health insurance.  She was previously taking Lexapro with good results but stopped it due to cost and feeling that she had things under control. Over the past year, she has been experiencing more symptoms of depression: fatigue, dysphoric mood, irritability, difficulty concentrating.   Her relationship with her older daughter has been particularly difficult, and Claudia Dominguez (age 34) has decided to live exclusively with her father, refusing to engage in conversation with the patient. Savannah still lives part time with each parent. In addition, due to her moodiness and irritability, her romantic relationship has just ended. That is really the trigger for wanting to resume treatment.  Trying to stay active and get sunlight.  Also ready to resume COC. Very moody during period. Anxiety and depression get much worse. Rage.   Depression screen PHQ 2/9 11/03/2015  Decreased Interest 0  Down, Depressed, Hopeless 3  PHQ - 2 Score 3  Altered sleeping 2  Tired, decreased energy 3  Change in appetite 2  Feeling bad or failure about yourself  3  Trouble concentrating 3  Moving slowly or fidgety/restless 0  Suicidal thoughts 0  PHQ-9 Score 16    Review of Systems As above.    Patient Active Problem List   Diagnosis Date Noted  . Lumbosacral strain 09/15/2014  . Marijuana abuse 09/15/2014  . Encounter for other general counseling or advice on contraception 09/15/2014  . Abnormal weight gain 01/09/2014  . Finger pain, left 01/09/2014  . Finger fracture, left 01/09/2014  . Eustachian tube dysfunction 01/09/2014  . DEPRESSION,  SITUATIONAL 01/18/2007     Prior to Admission medications   Medication Sig Start Date End Date Taking? Authorizing Provider  escitalopram (LEXAPRO) 20 MG tablet Take 1 tablet (20 mg total) by mouth daily. Please schedule a f/u appt. for future refills. 01/06/15  Yes Laren Boom, DO     Allergies  Allergen Reactions  . Latex Hives  . Penicillins Hives    REACTION: as a baby       Objective:  Physical Exam  Constitutional: She is oriented to person, place, and time. She appears well-developed and well-nourished. She is active and cooperative. No distress.  BP 114/78 (BP Location: Left Arm, Patient Position: Sitting, Cuff Size: Normal)   Pulse 68   Temp 98 F (36.7 C) (Oral)   Resp 17   Ht 5\' 7"  (1.702 m)   Wt 174 lb (78.9 kg)   LMP 10/28/2015   SpO2 99%   BMI 27.25 kg/m   HENT:  Head: Normocephalic and atraumatic.  Right Ear: Hearing normal.  Left Ear: Hearing normal.  Eyes: Conjunctivae are normal. No scleral icterus.  Neck: Normal range of motion. Neck supple. No thyromegaly present.  Cardiovascular: Normal rate, regular rhythm and normal heart sounds.   Pulses:      Radial pulses are 2+ on the right side, and 2+ on the left side.  Pulmonary/Chest: Effort normal and breath sounds normal.  Lymphadenopathy:       Head (right side): No tonsillar, no preauricular, no posterior auricular and no occipital adenopathy present.  Head (left side): No tonsillar, no preauricular, no posterior auricular and no occipital adenopathy present.    She has no cervical adenopathy.       Right: No supraclavicular adenopathy present.       Left: No supraclavicular adenopathy present.  Neurological: She is alert and oriented to person, place, and time. No sensory deficit.  Skin: Skin is warm, dry and intact. No rash noted. No cyanosis or erythema. Nails show no clubbing.  Psychiatric: She has a normal mood and affect. Her speech is normal and behavior is normal.             Assessment & Plan:   1. Anxiety and depression Resume escitalopram, taking 1/2 tablet daily for the first week. Let me know by phone or My Chart how she's doing in 4-6 weeks. - escitalopram (LEXAPRO) 20 MG tablet; Take 1 tablet (20 mg total) by mouth daily.  Dispense: 90 tablet; Refill: 3  2. Encounter for other general counseling or advice on contraception Resume COC. - norgestimate-ethinyl estradiol (ORTHO-CYCLEN,SPRINTEC,PREVIFEM) 0.25-35 MG-MCG tablet; Take 1 tablet by mouth daily.  Dispense: 3 Package; Refill: 4  3. HSV infection She'd prefer to switch from episodic treatment to daily suppressive therapy. - valACYclovir (VALTREX) 1000 MG tablet; Take 0.5 tablets (500 mg total) by mouth daily.  Dispense: 90 tablet; Refill: 3   Claudia Brashelle S. Yousof Alderman, PA-C Physician Assistant-Certified [facility]

## 2015-11-03 NOTE — Patient Instructions (Addendum)
For the first week, just take 1/2 of the Lexapro tablet.  If you choose, you can take valacyclovir 500 mg (1/2 tablet) every 12 hours for 3 days for a herpes outbreak instead of taking it daily.  Let me know how this is working in about 4 weeks.    IF you received an x-ray today, you will receive an invoice from [facility]. Please contact [facility] at [phone] with questions or concerns regarding your invoice.   IF you received labwork today, you will receive an invoice from [facility]. Please contact [facility] at [phone] with questions or concerns regarding your invoice.   Our billing staff will not be able to assist you with questions regarding bills from these companies.  You will be contacted with the lab results as soon as they are available. The fastest way to get your results is to activate your My Chart account. Instructions are located on the last page of this paperwork. If you have not heard from us regarding the results in 2 weeks, please contact this office.

## 2015-11-05 ENCOUNTER — Telehealth: Payer: Self-pay

## 2015-11-05 NOTE — Telephone Encounter (Signed)
[name]  The three medications you prescribed are over $1000 can you prescribe generics.   [facility] [address]    [phone]

## 2015-11-06 NOTE — Telephone Encounter (Signed)
These medications should all be generic, if they are expensive she should try another pharmacy. I have called her to advise.  Left message for her to call us back

## 2015-11-06 NOTE — Telephone Encounter (Signed)
Patient states the Valcyclovir is not affordable / I have pulled up on [facility]  and she should be able to get this at [facility] for$[facility]. She will try this, if it does not work, she will call us back and let us know . To you FYI

## 2016-05-22 ENCOUNTER — Emergency Department (HOSPITAL_BASED_OUTPATIENT_CLINIC_OR_DEPARTMENT_OTHER)
Admission: EM | Admit: 2016-05-22 | Discharge: 2016-05-22 | Disposition: A | Discharge status: Home / Self Care | Payer: Self-pay | Attending: Emergency Medicine | Admitting: Emergency Medicine

## 2016-05-22 ENCOUNTER — Encounter (HOSPITAL_BASED_OUTPATIENT_CLINIC_OR_DEPARTMENT_OTHER): Payer: Self-pay | Admitting: Emergency Medicine

## 2016-05-22 DIAGNOSIS — M7918 Myalgia, other site: Secondary | ICD-10-CM

## 2016-05-22 DIAGNOSIS — M542 Cervicalgia: Secondary | ICD-10-CM | POA: Insufficient documentation

## 2016-05-22 DIAGNOSIS — R51 Headache: Secondary | ICD-10-CM | POA: Insufficient documentation

## 2016-05-22 DIAGNOSIS — Y9389 Activity, other specified: Secondary | ICD-10-CM | POA: Insufficient documentation

## 2016-05-22 DIAGNOSIS — Y92410 Unspecified street and highway as the place of occurrence of the external cause: Secondary | ICD-10-CM | POA: Insufficient documentation

## 2016-05-22 DIAGNOSIS — Y999 Unspecified external cause status: Secondary | ICD-10-CM | POA: Insufficient documentation

## 2016-05-22 MED ORDER — CYCLOBENZAPRINE HCL 10 MG PO TABS
10.0000 mg | ORAL_TABLET | Freq: Three times a day (TID) | ORAL | 0 refills | Status: AC | PRN
Start: 2016-05-22 — End: 2016-05-24

## 2016-05-22 MED ORDER — IBUPROFEN 800 MG PO TABS: 800.0000 mg | ORAL_TABLET | Freq: Four times a day (QID) | ORAL | 0 refills | Status: AC | PRN

## 2016-05-22 MED FILL — IBUPROFEN 800 MG TABLET: 800 | 2 days supply | Qty: 8 | Fill #0

## 2016-05-22 MED FILL — CYCLOBENZAPRINE 10 MG TAB: 10 | 2 days supply | Qty: 6 | Fill #0
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
## 2016-05-22 NOTE — Discharge Instructions (Signed)
You may take ibuprofen every 6 hours as needed. Heat, ice, gentle stretching may be helpful. Take flexeril up to 3 times per day as needed for muscle spasm. If you experience any confusion, issues walking, fever/chills, loss of bowel and bladder control return to the ED>

## 2016-09-08 ENCOUNTER — Telehealth: Payer: Self-pay | Admitting: Physician Assistant

## 2016-09-08 NOTE — Telephone Encounter (Signed)
PT CALLING HEATHER BACK
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
## 2016-09-08 NOTE — Telephone Encounter (Signed)
Attempted to call pt back but no answer and voicemail was full.

## 2016-09-08 NOTE — Telephone Encounter (Signed)
Letter printed and signed at 104. 

## 2016-09-08 NOTE — Telephone Encounter (Signed)
Yes, I just need to know what specifics the letter needs to include.

## 2016-09-08 NOTE — Telephone Encounter (Signed)
[facility] getting [facility] note about her back issues for her community service that she is suppose to be doing.  Her call back number is [phone]

## 2016-09-08 NOTE — Telephone Encounter (Signed)
Spoke with patient via phone an advised letter printed and signed and ready for pickup at her convenience.  Patient appreciative. dpg

## 2016-09-08 NOTE — Telephone Encounter (Signed)
Please advise.Marland Kitchen.Marland Kitchen.I can type it up for you if I need too.

## 2017-04-05 ENCOUNTER — Other Ambulatory Visit: Payer: Self-pay | Admitting: Physician Assistant

## 2017-04-05 DIAGNOSIS — F32A Depression, unspecified: Secondary | ICD-10-CM

## 2017-04-05 DIAGNOSIS — F329 Major depressive disorder, single episode, unspecified: Secondary | ICD-10-CM

## 2017-04-05 DIAGNOSIS — F419 Anxiety disorder, unspecified: Principal | ICD-10-CM

## 2017-04-06 NOTE — Telephone Encounter (Signed)
Contacted pt regarding prescription refill; last office visit 11/03/15; pt states that she does not have insurance but can schedule appointment within 30 days.

## 2017-05-31 ENCOUNTER — Encounter: Payer: Self-pay | Admitting: Physician Assistant

## 2017-07-19 ENCOUNTER — Encounter: Payer: Self-pay | Admitting: Physician Assistant

## 2017-07-19 ENCOUNTER — Ambulatory Visit (INDEPENDENT_AMBULATORY_CARE_PROVIDER_SITE_OTHER): Payer: Self-pay | Admitting: Physician Assistant

## 2017-07-19 VITALS — BP 122/74 | HR 82 | Temp 99.1°F | Resp 16 | Ht 66.69 in | Wt 187.6 lb

## 2017-07-19 DIAGNOSIS — B009 Herpesviral infection, unspecified: Secondary | ICD-10-CM

## 2017-07-19 DIAGNOSIS — R5383 Other fatigue: Secondary | ICD-10-CM

## 2017-07-19 DIAGNOSIS — F32A Depression, unspecified: Secondary | ICD-10-CM

## 2017-07-19 DIAGNOSIS — Z3009 Encounter for other general counseling and advice on contraception: Secondary | ICD-10-CM

## 2017-07-19 DIAGNOSIS — R635 Abnormal weight gain: Secondary | ICD-10-CM

## 2017-07-19 DIAGNOSIS — F419 Anxiety disorder, unspecified: Secondary | ICD-10-CM

## 2017-07-19 DIAGNOSIS — F329 Major depressive disorder, single episode, unspecified: Secondary | ICD-10-CM

## 2017-07-19 DIAGNOSIS — N926 Irregular menstruation, unspecified: Secondary | ICD-10-CM

## 2017-07-19 MED ORDER — VALACYCLOVIR HCL 1 G PO TABS: 500.0000 mg | ORAL_TABLET | Freq: Every day | ORAL | 12 refills | Status: AC

## 2017-07-19 MED ORDER — NORGESTIMATE-ETH ESTRADIOL 0.25-35 MG-MCG PO TABS: 1.0000 | ORAL_TABLET | Freq: Every day | ORAL | 4 refills | Status: AC

## 2017-07-19 MED ORDER — ESCITALOPRAM OXALATE 20 MG PO TABS: 20.0000 mg | ORAL_TABLET | Freq: Every day | ORAL | 12 refills | Status: AC
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
## 2017-07-19 NOTE — Patient Instructions (Signed)
     IF you received an x-ray today, you will receive an invoice from [facility]. Please contact [facility] at [phone] with questions or concerns regarding your invoice.   IF you received labwork today, you will receive an invoice from [facility]. Please contact [facility] at [phone] with questions or concerns regarding your invoice.   Our billing staff will not be able to assist you with questions regarding bills from these companies.  You will be contacted with the lab results as soon as they are available. The fastest way to get your results is to activate your My Chart account. Instructions are located on the last page of this paperwork. If you have not heard from us regarding the results in 2 weeks, please contact this office.     

## 2017-07-20 LAB — TSH: TSH: 4.28 u[IU]/mL (ref 0.450–4.500)

## 2017-07-21 NOTE — Assessment & Plan Note (Signed)
Not well controlled since stopping Lexapro.  Resume.

## 2017-07-21 NOTE — Assessment & Plan Note (Signed)
Likely due to increased calories, decreased exercise.  Use of marijuana likely also contributes.  Counseled on healthy eating choices and increasing exercise to 150 minutes/week.  TSH updated today.

## 2017-07-22 ENCOUNTER — Encounter: Payer: Self-pay | Admitting: Physician Assistant

## 2018-07-23 ENCOUNTER — Ambulatory Visit: Payer: Self-pay | Admitting: *Deleted
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
## 2019-11-19 ENCOUNTER — Telehealth: Payer: Self-pay

## 2019-11-19 NOTE — Telephone Encounter (Signed)
Patient called and scheduled a [facility] appointment today, stated complaints of left breast/chest pain x 7 days. Attempted to contact patient to determine if breast or chest pain, left message on identifying voicemail requesting a return call.

## 2019-12-05 ENCOUNTER — Telehealth: Payer: Self-pay

## 2019-12-05 ENCOUNTER — Other Ambulatory Visit: Payer: Self-pay | Admitting: Obstetrics and Gynecology

## 2019-12-05 DIAGNOSIS — N644 Mastodynia: Secondary | ICD-10-CM

## 2019-12-05 NOTE — Telephone Encounter (Signed)
Left message on identifying requesting patient to return call regarding 12/18/2019 [facility] appointment.

## 2019-12-18 ENCOUNTER — Ambulatory Visit: Payer: Self-pay

## 2019-12-18 ENCOUNTER — Other Ambulatory Visit: Payer: Self-pay

## 2020-01-29 ENCOUNTER — Other Ambulatory Visit: Payer: Self-pay

## 2020-01-29 ENCOUNTER — Ambulatory Visit: Payer: Self-pay

## 2020-04-15 ENCOUNTER — Telehealth: Payer: Self-pay

## 2020-04-15 NOTE — Telephone Encounter (Addendum)
Patient called and stated that she is can see and feel a lump in vagina, thinks it may be near cervix, possibly 3 lumps,  checked with mirror. Patient denies any pain, discomfort, or abnormal bleeding/discharge. Patient states she does not notice any pain with sexual intercourse. Patient states she uses Depo Provera injection,and does have menstrual cycles while using. Patient states she is well overdue for pap smear (last pap date in epic, 12/31/2012 -negative @ [facility],  04/06/2008-negative), and really would like to come to [facility] to do pap smear and have lump in left breast checked. Patient informed that [facility] will cover diagnostic mammogram and pap smear, but lumps on vagina may require an evaluation with a gynecologist. Patients are usually referred to [facility]. Patient verbalized understanding.  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 Patient informed Per [facility] policy must keep this  Appointment to remain eligible for [facility],  as she has been scheduled for 2 appointments previously, after 3 appointments are missed, will not be scheduled again. Patient stated she will be at appointment 04/20/2020 @ 11:00am, address provided ([address] [place])

## 2020-04-20 ENCOUNTER — Ambulatory Visit: Payer: Self-pay

## 2020-04-20 ENCOUNTER — Other Ambulatory Visit: Payer: Self-pay

## 2020-04-20 ENCOUNTER — Ambulatory Visit: Payer: Self-pay | Admitting: *Deleted

## 2020-04-20 VITALS — BP 128/88 | Wt 200.2 lb

## 2020-04-20 DIAGNOSIS — Z01419 Encounter for gynecological examination (general) (routine) without abnormal findings: Secondary | ICD-10-CM

## 2020-04-20 DIAGNOSIS — N644 Mastodynia: Secondary | ICD-10-CM

## 2020-04-20 NOTE — Progress Notes (Signed)
Ms. Claudia Dominguez is a 40 y.o. G6P0 female who presents to [facility] clinic today with complaint of left breast pain and lump x 1-2 months. Patient states the left breast pain comes and goes. Patient rates the pain at a 2 out of 10. Patient complained of a lump in vagina.    Pap Smear: Pap smear completed today. Last Pap smear was 3-4 years ago at the [facility] clinic and was normal per patient. Per patient has history of an abnormal Pap smear around 10 years ago that a repeat Pap smear was completed for follow-up that was normal. Patient unsure if she has had at least three normal Pap smears since abnormal Pap smear. Last Pap smear result is not available in Epic. Pap smear result from 04/06/2008 is available in Epic.   Physical exam: Breasts Breasts symmetrical. No skin abnormalities bilateral breasts. No nipple retraction bilateral breasts. No nipple discharge bilateral breasts. No lymphadenopathy. No lumps palpated bilateral breasts. Unable to palpate a lump in patients area of concern within the left breast. Complaints of bilateral outer breast pain on exam.       Pelvic/Bimanual Ext Genitalia No lesions, no swelling and no discharge observed on external genitalia.        Vagina Vagina pink and normal texture. No lesions or discharge observed in vagina. Patient complained of a vaginal lump. No lumps observed on exam. Referral sent to the [facility] for [facility] per patients request. Patient informed that [facility] will not cover and given the information for the [facility] Application.        Cervix Cervix is present. Cervix pink and of normal texture. Scant amount of whitish colored discharge observed at cervical os.    Uterus Uterus is present and palpable. Uterus in normal position and normal size.        Adnexae Bilateral ovaries present and palpable. No tenderness on palpation.         Rectovaginal No rectal exam completed today  since patient had no rectal complaints. No skin abnormalities observed on exam.     Smoking History: Patient is a former smoker that quit 30 days ago. Patient stated she smokes marijuana and not cigarettes.   Patient Navigation: Patient education provided. Access to services provided for patient through [facility] program.    Breast and Cervical Cancer Risk Assessment: Patient does not have family history of breast cancer, known genetic mutations, or radiation treatment to the chest before age 47. Patient does not have history of cervical dysplasia, immunocompromised, or DES exposure in-utero.  Risk Assessment    Risk Scores      04/20/2020   Last edited by: Meryl Dare, CMA   5-year risk:    Lifetime risk:           A: [facility] exam with pap smear Complaint of left breast pain and lump. Complaints of lump in vagina.  P: Referred patient to the [facility] for a diagnostic mammogram. Appointment scheduled Wednesday, May 11, 2020 at 0910.  Priscille Heidelberg, RN 04/20/2020 11:22 AM

## 2020-04-20 NOTE — Patient Instructions (Signed)
Explained breast self awareness with Tal [place]. Pap smear completed today. Let her know [facility] will cover Pap smears every 3 years unless has a history of abnormal Pap smears. Referred patient to the [facility] for a diagnostic mammogram. Appointment scheduled Wednesday, May 11, 2020 at 0910. Patient aware of appointment and will be there. Let patient know will send a referral to the [facility] for [facility] and they will call her with appointment. Ashlay Mester verbalized understanding.  Mavryk Pino, Kathaleen Maser, RN 11:22 AM

## 2020-04-21 LAB — CYTOLOGY - PAP
Comment: NEGATIVE
Diagnosis: NEGATIVE
High risk HPV: NEGATIVE

## 2020-04-22 ENCOUNTER — Telehealth: Payer: Self-pay

## 2020-04-22 NOTE — Telephone Encounter (Signed)
Attempted to contact patient to give pap results. VM box full, unable to leave message. Will try again later.

## 2020-04-22 NOTE — Telephone Encounter (Signed)
Attempted to contact patient to give pap results. VM box full unable to leave message.

## 2020-04-26 ENCOUNTER — Telehealth: Payer: Self-pay

## 2020-04-26 NOTE — Telephone Encounter (Signed)
Attempted to contact patient regarding Pap/HPV results.Not able to leave message on identifying voicemail, "voice mailbox is full". Normal Pap/HPV letter mailed.

## 2020-05-10 ENCOUNTER — Encounter: Payer: Self-pay | Admitting: Obstetrics and Gynecology

## 2020-05-11 ENCOUNTER — Other Ambulatory Visit: Payer: Self-pay

## 2020-05-11 ENCOUNTER — Inpatient Hospital Stay: Admission: RE | Admit: 2020-05-11 | Payer: Self-pay | Source: Ambulatory Visit

## 2023-05-31 ENCOUNTER — Other Ambulatory Visit: Payer: Self-pay | Admitting: Obstetrics and Gynecology

## 2023-05-31 DIAGNOSIS — Z1231 Encounter for screening mammogram for malignant neoplasm of breast: Secondary | ICD-10-CM

## 2023-06-04 ENCOUNTER — Ambulatory Visit
Admission: RE | Admit: 2023-06-04 | Discharge: 2023-06-04 | Disposition: A | Discharge status: Home / Self Care | Payer: Self-pay | Source: Ambulatory Visit | Attending: Obstetrics and Gynecology | Admitting: Obstetrics and Gynecology

## 2023-06-04 DIAGNOSIS — Z1231 Encounter for screening mammogram for malignant neoplasm of breast: Secondary | ICD-10-CM

## 2023-06-05 ENCOUNTER — Encounter: Payer: Self-pay | Admitting: Obstetrics and Gynecology

## 2023-06-05 ENCOUNTER — Other Ambulatory Visit: Payer: Self-pay | Admitting: Obstetrics and Gynecology

## 2023-06-05 DIAGNOSIS — N6320 Unspecified lump in the left breast, unspecified quadrant: Secondary | ICD-10-CM

## 2023-06-12 ENCOUNTER — Encounter

## 2023-06-12 ENCOUNTER — Other Ambulatory Visit

## 2024-04-04 ENCOUNTER — Telehealth: Payer: Self-pay

## 2024-04-04 NOTE — Telephone Encounter (Signed)
 Patient telephoned and completed screening process. Declined scheduling and released the call. [facility]
